# Patient Record
Sex: Female | Born: 2000 | Race: Black or African American | Hispanic: No | Marital: Single | Smoking: Never smoker
Health system: Southern US, Community
[De-identification: ages and names within clinical notes are randomized; demographics above are authoritative.]

---
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
## 2022-04-05 ENCOUNTER — Encounter: Payer: Self-pay | Admitting: Emergency Medicine

## 2022-04-05 ENCOUNTER — Other Ambulatory Visit: Payer: Self-pay

## 2022-04-05 DIAGNOSIS — M79661 Pain in right lower leg: Secondary | ICD-10-CM | POA: Diagnosis not present

## 2022-04-05 DIAGNOSIS — M79662 Pain in left lower leg: Secondary | ICD-10-CM | POA: Diagnosis present

## 2022-04-05 NOTE — ED Triage Notes (Signed)
Patient ambulatory to triage with steady gait, without difficulty or distress noted; pt reports that her legs have been aching today; denies any injury, denies any accomp symptoms

## 2022-04-06 ENCOUNTER — Emergency Department
Admission: EM | Admit: 2022-04-06 | Discharge: 2022-04-06 | Disposition: A | Discharge status: Home / Self Care | Payer: Federal, State, Local not specified - PPO | Attending: Emergency Medicine | Admitting: Emergency Medicine

## 2022-04-06 DIAGNOSIS — G4762 Sleep related leg cramps: Secondary | ICD-10-CM

## 2022-04-06 DIAGNOSIS — M79604 Pain in right leg: Secondary | ICD-10-CM
